# Patient Record
Sex: Female | Born: 1990
Health system: Southern US, Community
[De-identification: ages and names within clinical notes are randomized; demographics above are authoritative.]

---
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
## 2013-04-25 ENCOUNTER — Ambulatory Visit (INDEPENDENT_AMBULATORY_CARE_PROVIDER_SITE_OTHER): Payer: 59 | Admitting: Emergency Medicine

## 2013-04-25 ENCOUNTER — Encounter: Payer: Self-pay | Admitting: Emergency Medicine

## 2013-04-25 VITALS — BP 133/83 | HR 88 | Temp 98.6°F | Resp 16 | Ht 67.0 in | Wt 225.6 lb

## 2013-04-25 DIAGNOSIS — S01511A Laceration without foreign body of lip, initial encounter: Secondary | ICD-10-CM

## 2013-04-25 DIAGNOSIS — G501 Atypical facial pain: Secondary | ICD-10-CM

## 2013-04-25 DIAGNOSIS — S01501A Unspecified open wound of lip, initial encounter: Secondary | ICD-10-CM

## 2013-04-25 NOTE — Progress Notes (Signed)
[facility] [address] [place] [address] [phone]  Date:  04/25/2013   Name:  Marie Larson   DOB:  1991-06-05   MRN:  [id number]  PCP:  No PCP Per Patient    Chief Complaint: Laceration   History of Present Illness:  Marie Larson is a 22 y.o. very pleasant female patient who presents with the following:  Walked into an awning at her neighbor's porch and it struck her in the right upper lip.  She denies neck pain or neurologic symptoms.  No LOC.  No intraoral injury.  Not current on TD.  Denies other complaint or health concern today.   There are no active problems to display for this patient.   No past medical history on file.  No past surgical history on file.  History  Substance Use Topics  . Smoking status: Not on file  . Smokeless tobacco: Not on file  . Alcohol Use: Not on file    No family history on file.  Allergies not on file  Medication list has been reviewed and updated.  No current outpatient prescriptions on file prior to visit.   No current facility-administered medications on file prior to visit.    Review of Systems:  As per HPI, otherwise negative.    Physical Examination: There were no vitals filed for this visit. There were no vitals filed for this visit. There is no height or weight on file to calculate BMI. Ideal Body Weight:     GEN: WDWN, NAD, Non-toxic, Alert & Oriented x 3 HEENT: laceration cutaneous upper lip total length 2 cm.  Inferior based flap.  Dentition, occlusion and TMJ intact, Normocephalic.  Ears and Nose: No external deformity. Neck Supple no t tender EXTR: No clubbing/cyanosis/edema NEURO: Normal gait.  PSYCH: Normally interactive. Conversant. Not depressed or anxious appearing.  Calm demeanor.    Assessment and Plan: Laceration upper lip.  Signed,  Phillips Odor, MD

## 2013-04-25 NOTE — Progress Notes (Signed)
   Patient ID: VALERIA BOZA MRN: [id number], DOB: 26-Jul-1991, 22 y.o. Date of Encounter: 04/25/2013, 9:38 PM   PROCEDURE NOTE: Verbal consent obtained.  Risks and benefits of the procedure were explained. Patient made an informed decision to proceed with the procedure. Sterile technique employed. Numbing: Anesthesia obtained with 1% lidocaine with epi 2 cc for local.   Cleansed with soap and water. Irrigated. Betadine prep per usual protocol.  Wound explored, no deep structures involved, no foreign bodies.   Wound repaired with # 5 simple interrupted sutures. Vermilion border approximated first, then flap pulled up, and then remaining sutures were placed.    Hemostasis obtained. Wound cleansed and dressed.  Wound care instructions including precautions covered with patient. Handout given.  Anticipate suture removal in 5 days.   SignedEula Listen, PA-C 04/25/2013 9:38 PM

## 2013-04-26 NOTE — Progress Notes (Signed)
Reviewed and agree.

## 2013-05-01 ENCOUNTER — Ambulatory Visit (INDEPENDENT_AMBULATORY_CARE_PROVIDER_SITE_OTHER): Payer: 59 | Admitting: Physician Assistant

## 2013-05-01 DIAGNOSIS — S0180XD Unspecified open wound of other part of head, subsequent encounter: Secondary | ICD-10-CM

## 2013-05-01 DIAGNOSIS — Z5189 Encounter for other specified aftercare: Secondary | ICD-10-CM

## 2013-05-01 NOTE — Progress Notes (Signed)
HPI: Patient here for suture removal. DOI 04/25/13. Wound well healing. Denies erythema, warmth, or drainage.   ROS: Positive for wound.   PE:  Skin: wound of upper lip well healing with scab in place. No erythema, warmth, or drainage  #5 sutures removed without difficulty. Patient tolerated well  A/P: Wound face.   Sutures removed.  Wound care provided.   Follow up as needed.

## 2016-02-29 DIAGNOSIS — Z6841 Body Mass Index (BMI) 40.0 and over, adult: Secondary | ICD-10-CM | POA: Diagnosis not present

## 2016-02-29 DIAGNOSIS — Z01419 Encounter for gynecological examination (general) (routine) without abnormal findings: Secondary | ICD-10-CM | POA: Diagnosis not present

## 2016-04-17 ENCOUNTER — Emergency Department
Admission: EM | Admit: 2016-04-17 | Discharge: 2016-04-17 | Disposition: A | Discharge status: Home / Self Care | Payer: 59 | Source: Home / Self Care | Attending: Family Medicine | Admitting: Family Medicine

## 2016-04-17 ENCOUNTER — Encounter: Payer: Self-pay | Admitting: Emergency Medicine

## 2016-04-17 DIAGNOSIS — J029 Acute pharyngitis, unspecified: Secondary | ICD-10-CM

## 2016-04-17 DIAGNOSIS — R0982 Postnasal drip: Secondary | ICD-10-CM | POA: Diagnosis not present

## 2016-04-17 LAB — POCT RAPID STREP A (OFFICE): Rapid Strep A Screen: NEGATIVE

## 2016-04-17 NOTE — ED Provider Notes (Signed)
CSN: [id number]     Arrival date & time 04/17/16  1138 History   First MD Initiated Contact with Patient 04/17/16 1159     Chief Complaint  Patient presents with  . Sore Throat   (Consider location/radiation/quality/duration/timing/severity/associated sxs/prior Treatment) HPI  The pt is a 24yo [facility] mother with c/o sore throat that started 3 days ago. Pain gradually worsens throughout the day, worse with swallowing, 2/10 at worst. Pt and mother note that pt's allergies have been worsening so she has been taking Zyrtec in morning and at night to help but no relief of pain. She has not been taking acetaminophen or ibuprofen for pain. Denies fever, chills, n/v/d. Denies cough. Mild nasal congestion and post-nasal drip.   History reviewed. No pertinent past medical history. History reviewed. No pertinent past surgical history. Family History  Problem Relation Age of Onset  . Diabetes Mother   . Hypertension Father    Social History  Substance Use Topics  . Smoking status: Never Smoker   . Smokeless tobacco: None  . Alcohol Use: No   OB History    No data available     Review of Systems  Constitutional: Negative for fever and chills.  HENT: Positive for congestion, postnasal drip, rhinorrhea, sneezing and sore throat. Negative for ear pain, sinus pressure and voice change.   Respiratory: Negative for cough and shortness of breath.   Gastrointestinal: Negative for nausea, vomiting, abdominal pain and diarrhea.  Neurological: Negative for dizziness, light-headedness and headaches.    Allergies  Sulfa antibiotics  Home Medications   Prior to Admission medications   Medication Sig Start Date End Date Taking? Authorizing Provider  norgestrel-ethinyl estradiol (CRYSELLE-28) 0.3-30 MG-MCG tablet Take 1 tablet by mouth daily.    Historical Provider, MD   Meds Ordered and Administered this Visit  Medications - No data to display  BP 118/86 mmHg  Pulse 101   Temp(Src) 98.6 F (37 C) (Oral)  Resp 16  Ht 5\' 6"  (1.676 m)  Wt 255 lb (115.667 kg)  BMI 41.18 kg/m2  SpO2 99%  LMP 04/08/2016 (Approximate) No data found.   Physical Exam  Constitutional: She appears well-developed and well-nourished. No distress.  HENT:  Head: Normocephalic and atraumatic.  Right Ear: Tympanic membrane normal.  Left Ear: Tympanic membrane normal.  Nose: Mucosal edema present. No rhinorrhea.  Mouth/Throat: Uvula is midline and mucous membranes are normal. Posterior oropharyngeal erythema present. No oropharyngeal exudate, posterior oropharyngeal edema or tonsillar abscesses.  Eyes: Conjunctivae are normal. No scleral icterus.  Neck: Normal range of motion. Neck supple.  Cardiovascular: Normal rate, regular rhythm and normal heart sounds.   Pulmonary/Chest: Effort normal. No stridor. No respiratory distress. She has no wheezes. She has no rales.  Abdominal: Soft. She exhibits no distension. There is no tenderness.  Musculoskeletal: Normal range of motion.  Lymphadenopathy:    She has no cervical adenopathy.  Neurological: She is alert.  Skin: Skin is warm and dry. She is not diaphoretic.  Nursing note and vitals reviewed.   ED Course  Procedures (including critical care time)  Labs Review Labs Reviewed  POCT RAPID STREP A (OFFICE)    Imaging Review No results found.    MDM   1. Sore throat   2. Post-nasal drip    Pt c/o sore throat. No evidence of peritonsillar abscess.  Rapid strep: Negative  Symptoms likely due to virus or post-nasal drip. Encouraged symptomatic treatment. Advised pt to use acetaminophen and ibuprofen  as needed for fever and pain. Encouraged rest and fluids. F/u with PCP in 7-10 days if not improving, sooner if worsening. Pt verbalized understanding and agreement with tx plan.    Junius Finner, PA-C 04/17/16 1251

## 2016-04-17 NOTE — Discharge Instructions (Signed)
You may take 400-600mg  Ibuprofen (Motrin) every 6-8 hours for fever and pain  Alternate with Tylenol  You may take  Tylenol every 4-6 hours as needed for fever and pain  Follow-up with your primary care provider next week for recheck of symptoms if not improving.  Be sure to drink plenty of fluids and rest, at least 8hrs of sleep a night, preferably more while you are sick. Return urgent care or go to closest ER if you cannot keep down fluids/signs of dehydration, fever not reducing with Tylenol, difficulty breathing/wheezing, stiff neck, worsening condition, or other concerns (see below)  Please take antibiotics as prescribed and be sure to complete entire course even if you start to feel better to ensure infection does not come back.   Pharyngitis Pharyngitis is a sore throat (pharynx). There is redness, pain, and swelling of your throat. HOME CARE   Drink enough fluids to keep your pee (urine) clear or pale yellow.  Only take medicine as told by your doctor.  You may get sick again if you do not take medicine as told. Finish your medicines, even if you start to feel better.  Do not take aspirin.  Rest.  Rinse your mouth (gargle) with salt water ( tsp of salt per 1 qt of water) every 1-2 hours. This will help the pain.  If you are not at risk for choking, you can suck on hard candy or sore throat lozenges. GET HELP IF:  You have large, tender lumps on your neck.  You have a rash.  You cough up green, yellow-Olesky, or bloody spit. GET HELP RIGHT AWAY IF:   You have a stiff neck.  You drool or cannot swallow liquids.  You throw up (vomit) or are not able to keep medicine or liquids down.  You have very bad pain that does not go away with medicine.  You have problems breathing (not from a stuffy nose). MAKE SURE YOU:   Understand these instructions.  Will watch your condition.  Will get help right away if you are not doing well or get worse.   This information  is not intended to replace advice given to you by your health care provider. Make sure you discuss any questions you have with your health care provider.   Document Released: 05/16/2008 Document Revised: 09/18/2013 Document Reviewed: 08/05/2013 Elsevier Interactive Patient Education 2016 Elsevier Inc.  Sore Throat A sore throat is pain, burning, irritation, or scratchiness of the throat. There is often pain or tenderness when swallowing or talking. A sore throat may be accompanied by other symptoms, such as coughing, sneezing, fever, and swollen neck glands. A sore throat is often the first sign of another sickness, such as a cold, flu, strep throat, or mononucleosis (commonly known as mono). Most sore throats go away without medical treatment. CAUSES  The most common causes of a sore throat include:  A viral infection, such as a cold, flu, or mono.  A bacterial infection, such as strep throat, tonsillitis, or whooping cough.  Seasonal allergies.  Dryness in the air.  Irritants, such as smoke or pollution.  Gastroesophageal reflux disease (GERD). HOME CARE INSTRUCTIONS   Only take over-the-counter medicines as directed by your caregiver.  Drink enough fluids to keep your urine clear or pale yellow.  Rest as needed.  Try using throat sprays, lozenges, or sucking on hard candy to ease any pain (if older than 4 years or as directed).  Sip warm liquids, such as broth, herbal tea,  or warm water with honey to relieve pain temporarily. You may also eat or drink cold or frozen liquids such as frozen ice pops.  Gargle with salt water (mix 1 tsp salt with 8 oz of water).  Do not smoke and avoid secondhand smoke.  Put a cool-mist humidifier in your bedroom at night to moisten the air. You can also turn on a hot shower and sit in the bathroom with the door closed for 5-10 minutes. SEEK IMMEDIATE MEDICAL CARE IF:  You have difficulty breathing.  You are unable to swallow fluids, soft  foods, or your saliva.  You have increased swelling in the throat.  Your sore throat does not get better in 7 days.  You have nausea and vomiting.  You have a fever or persistent symptoms for more than 2-3 days.  You have a fever and your symptoms suddenly get worse. MAKE SURE YOU:   Understand these instructions.  Will watch your condition.  Will get help right away if you are not doing well or get worse.   This information is not intended to replace advice given to you by your health care provider. Make sure you discuss any questions you have with your health care provider.   Document Released: 01/05/2005 Document Revised: 12/19/2014 Document Reviewed: 08/05/2012 Elsevier Interactive Patient Education [employer] Elsevier Inc[employer]

## 2016-04-17 NOTE — ED Notes (Signed)
Reports sore throat for 3 days; no recent OTC; no documented OTCs.

## 2016-12-08 DIAGNOSIS — H5213 Myopia, bilateral: Secondary | ICD-10-CM | POA: Diagnosis not present

## 2017-03-01 DIAGNOSIS — Z6841 Body Mass Index (BMI) 40.0 and over, adult: Secondary | ICD-10-CM | POA: Diagnosis not present

## 2017-03-01 DIAGNOSIS — Z01419 Encounter for gynecological examination (general) (routine) without abnormal findings: Secondary | ICD-10-CM | POA: Diagnosis not present

## 2017-05-26 MED FILL — ELINEST-28 TABLET: 0.3-30 | 84 days supply | Qty: 84 | Fill #0 | Status: TO

## 2017-11-08 MED FILL — ELINEST-28 TABLET: 0.3-30 | 84 days supply | Qty: 84 | Fill #0

## 2019-02-05 DIAGNOSIS — M25512 Pain in left shoulder: Secondary | ICD-10-CM | POA: Diagnosis not present

## 2019-05-14 DIAGNOSIS — M7522 Bicipital tendinitis, left shoulder: Secondary | ICD-10-CM | POA: Diagnosis not present

## 2019-07-01 DIAGNOSIS — M25512 Pain in left shoulder: Secondary | ICD-10-CM | POA: Diagnosis not present

## 2019-07-01 DIAGNOSIS — G8929 Other chronic pain: Secondary | ICD-10-CM | POA: Diagnosis not present
